# Patient Record
Sex: Male | Born: 1990 | Hispanic: Yes | ZIP: B2Y | Smoking: Never smoker
Health system: Southern US, Community
[De-identification: ages and names within clinical notes are randomized; demographics above are authoritative.]

---

## 2018-08-30 ENCOUNTER — Encounter: Payer: Self-pay | Admitting: Emergency Medicine

## 2018-08-30 ENCOUNTER — Emergency Department
Admission: EM | Admit: 2018-08-30 | Discharge: 2018-08-30 | Disposition: A | Discharge status: Home / Self Care | Payer: Worker's Compensation | Attending: Emergency Medicine | Admitting: Emergency Medicine

## 2018-08-30 ENCOUNTER — Emergency Department: Payer: Worker's Compensation

## 2018-08-30 DIAGNOSIS — W010XXA Fall on same level from slipping, tripping and stumbling without subsequent striking against object, initial encounter: Secondary | ICD-10-CM | POA: Diagnosis not present

## 2018-08-30 DIAGNOSIS — S99911A Unspecified injury of right ankle, initial encounter: Secondary | ICD-10-CM | POA: Diagnosis present

## 2018-08-30 DIAGNOSIS — S92101A Unspecified fracture of right talus, initial encounter for closed fracture: Secondary | ICD-10-CM | POA: Diagnosis not present

## 2018-08-30 DIAGNOSIS — Y929 Unspecified place or not applicable: Secondary | ICD-10-CM | POA: Diagnosis not present

## 2018-08-30 DIAGNOSIS — Y990 Civilian activity done for income or pay: Secondary | ICD-10-CM | POA: Insufficient documentation

## 2018-08-30 DIAGNOSIS — S82891A Other fracture of right lower leg, initial encounter for closed fracture: Secondary | ICD-10-CM

## 2018-08-30 DIAGNOSIS — Y9389 Activity, other specified: Secondary | ICD-10-CM | POA: Insufficient documentation

## 2018-08-30 MED ORDER — OXYCODONE-ACETAMINOPHEN 5-325 MG PO TABS
1.0000 | ORAL_TABLET | Freq: Once | ORAL | Status: AC
  Administered 2018-08-30: 1 via ORAL
  Filled 2018-08-30: qty 1

## 2018-08-30 NOTE — ED Provider Notes (Addendum)
[facility] Emergency Department Provider Note  ____________________________________________   I have reviewed the triage vital signs and the nursing notes. Where available I have reviewed prior notes and, if possible and indicated, outside hospital notes.    HISTORY  Chief Complaint Ankle Pain    HPI Jason Rowe is a 28 y.o. male who is in good shape, felt the back of a truck during work and sprained his ankle, unable to bear weight on it, no injury to knee head chest or anywhere else.  Non-syncopal event.  He refused pain medications for me and for EMS.  No other alleviating or aggravating factors.    History reviewed. No pertinent past medical history.  There are no active problems to display for this patient.   History reviewed. No pertinent surgical history.  Prior to Admission medications   Not on File    Allergies Patient has no known allergies.  History reviewed. No pertinent family history.  Social History Social History   Tobacco Use  . Smoking status: Never Smoker  . Smokeless tobacco: Never Used  Substance Use Topics  . Alcohol use: Never    Frequency: Never  . Drug use: Never    Review of Systems Constitutional: No fever/chills Eyes: No visual changes. ENT: No sore throat. No stiff neck no neck pain Cardiovascular: Denies chest pain. Respiratory: Denies shortness of breath. Gastrointestinal:   no vomiting.  No diarrhea.  No constipation. Genitourinary: Negative for dysuria. Musculoskeletal: Negative lower extremity swelling Skin: Negative for rash. Neurological: Negative for severe headaches, focal weakness or numbness.   ____________________________________________   PHYSICAL EXAM:  VITAL SIGNS: ED Triage Vitals [08/30/18 1531]  Enc Vitals Group     BP (!) 152/84     Pulse Rate 98     Resp 14     Temp 98 F (36.7 C)     Temp Source Oral     SpO2 100 %     Weight      Height      Head Circumference       Peak Flow      Pain Score 2     Pain Loc      Pain Edu?      Excl. in GC?     Constitutional: Alert and oriented. Well appearing and in no acute distress. Eyes: Conjunctivae are normal Head: Atraumatic HEENT: No congestion/rhinnorhea. Mucous membranes are moist.  Oropharynx non-erythematous Neck:   Nontender  Cardiovascular: Normal rate, regular rhythm. Grossly normal heart sounds.  Good peripheral circulation. Respiratory: Normal respiratory effort.  No retractions. Lungs CTAB. Abdominal: Soft and nontender. No distention. No guarding no rebound Back:  There is no focal tenderness or step off.  there is no midline tenderness there are no lesions noted. there is no CVA tenderness Musculoskeletal: Right foot is not tender, right ankle has some tenderness, there is some tenderness along the medial malleolus.  Strong distal pulses, no knee or hip discomfort. No joint effusions, no DVT signs strong distal pulses no edema Neurologic:  Normal speech and language. No gross focal neurologic deficits are appreciated.  Skin:  Skin is warm, dry and intact. No rash noted. Psychiatric: Mood and affect are normal. Speech and behavior are normal.  ____________________________________________   LABS (all labs ordered are listed, but only abnormal results are displayed)  Labs Reviewed - No data to display  Pertinent labs  results that were available during my care of the patient were reviewed by me and considered  in my medical decision making (see chart for details). ____________________________________________  EKG  I personally interpreted any EKGs ordered by me or triage  ____________________________________________  RADIOLOGY  Pertinent labs & imaging results that were available during my care of the patient were reviewed by me and considered in my medical decision making (see chart for details). If possible, patient and/or family made aware of any abnormal findings.  Dg Ankle  Complete Right  Result Date: 08/30/2018 CLINICAL DATA:  Right ankle pain EXAM: RIGHT ANKLE - COMPLETE 3+ VIEW COMPARISON:  None. FINDINGS: There is slight cortical irregularity along the medial talus adjacent to the medial malleolus which may reflect a subtle nondisplaced fracture. There is no other fracture or dislocation. There is no evidence of arthropathy or other focal bone abnormality. There is soft tissue swelling over the lateral malleolus. IMPRESSION: Slight cortical irregularity along the medial talus adjacent to the medial malleolus which may reflect a subtle nondisplaced fracture. Soft tissue swelling over the lateral malleolus. Electronically Signed   By: Elige Ko   On: 08/30/2018 16:24   ____________________________________________    PROCEDURES  Procedure(s) performed: None  Procedures  Critical Care performed: None  ____________________________________________   INITIAL IMPRESSION / ASSESSMENT AND PLAN / ED COURSE  Pertinent labs & imaging results that were available during my care of the patient were reviewed by me and considered in my medical decision making (see chart for details).  Patient with what appears to be a small nondisplaced fracture of his medial malleolus, x-ray does not show any other abnormalities and there is no other evidence of injury from the fall he still is denying pain medication we will place him in a splint and follow-up with orthopedic surgery.  No other injury noted.  ----------------------------------------- 5:40 PM on 08/30/2018 -----------------------------------------  Small nondisplaced fracture, splint placed, neurovascular intact, discharge instructions were given and understood.  She does not want a prescription for pain medication but he would like to have one before he goes.  He has been instructed that he cannot drive on pain medication.    ____________________________________________   FINAL CLINICAL IMPRESSION(S) / ED  DIAGNOSES  Final diagnoses:  None      This chart was dictated using voice recognition software.  Despite best efforts to proofread,  errors can occur which can change meaning.      Jeanmarie Plant, MD 08/30/18 1657    Jeanmarie Plant, MD 08/30/18 1740    Jeanmarie Plant, MD 08/30/18 [phone]

## 2018-08-30 NOTE — ED Notes (Signed)
Posterior splint applied with ace bandage and crutches provided   Lm edt

## 2018-08-30 NOTE — ED Notes (Signed)
Report received 

## 2018-08-30 NOTE — ED Notes (Signed)
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  Located in [place].  Spoke with patients manager alan [phone] and no testing to be done.

## 2018-08-30 NOTE — ED Notes (Signed)
Pt given meal tray  Lm edt

## 2018-08-30 NOTE — ED Notes (Signed)
Pt was working and a strap got caught around right ankle and pt fell.  Swelling to right ankle. DP pulse right leg WNL.

## 2018-08-30 NOTE — ED Notes (Signed)
Will help patient arrange stay at hotel since from out of town and cannot drive with splint on right leg.

## 2018-08-30 NOTE — ED Notes (Signed)
Will discharge in cab to hotel.

## 2018-08-30 NOTE — ED Triage Notes (Signed)
Pt presents via POV s/p fall with right ankle pain. Swelling noted.

## 2018-08-30 NOTE — ED Notes (Signed)
Patient transported to X-ray 

## 2020-09-24 IMAGING — CR DG ANKLE COMPLETE 3+V*R*
3 series · 3 of 3 positions shown · non-contrast
Comparison: None.

CLINICAL DATA: Right ankle pain

EXAM:
RIGHT ANKLE - COMPLETE 3+ VIEW

[ankle ap]
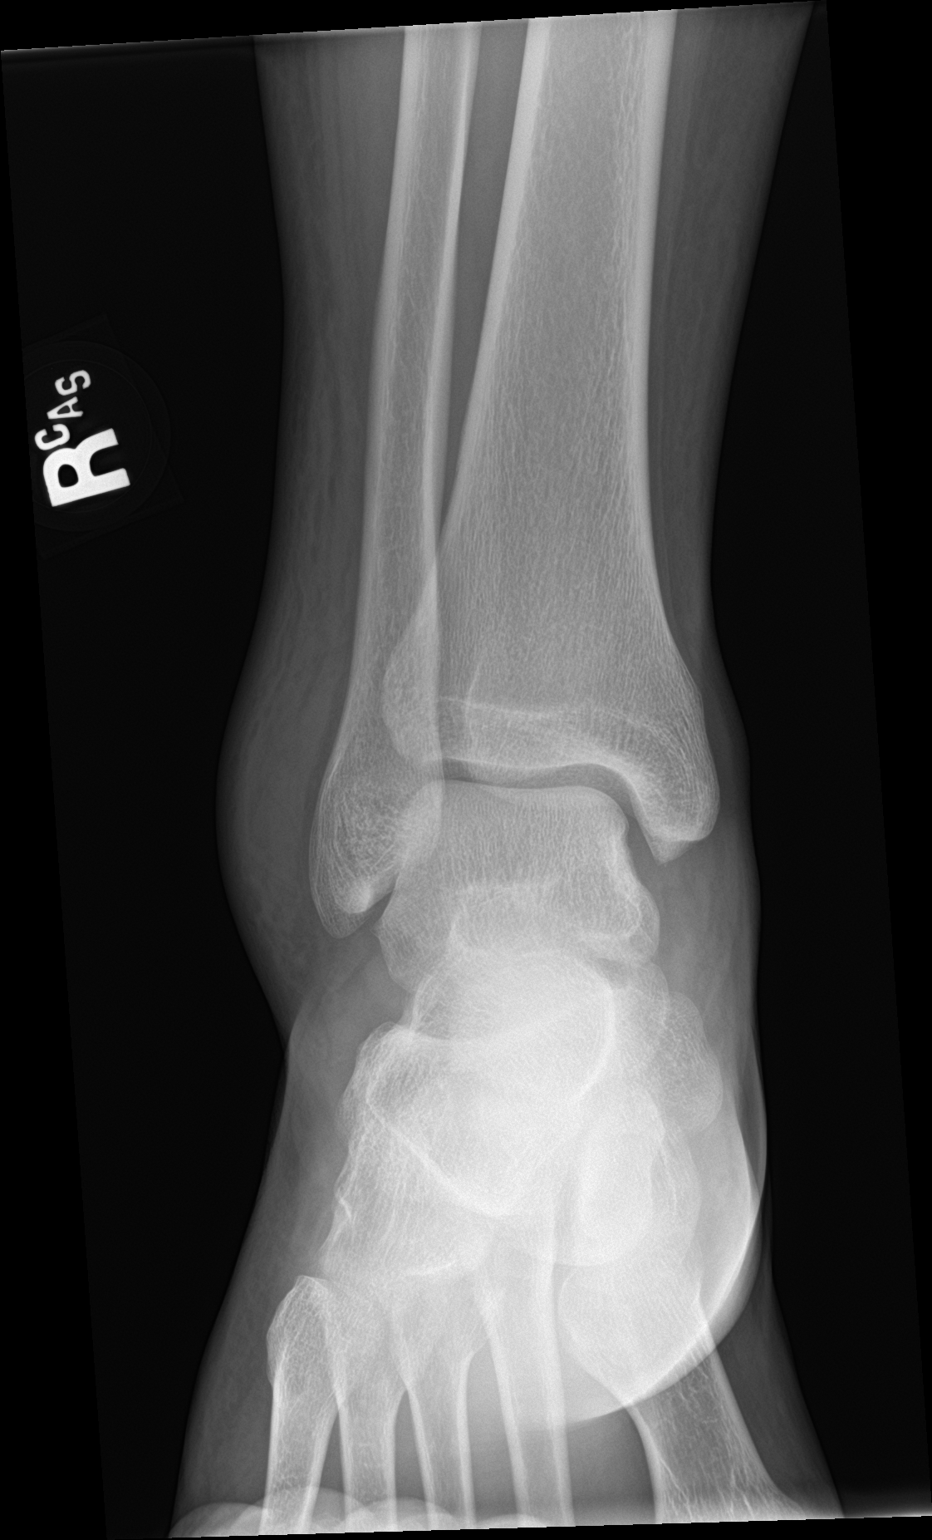

[ankle obl]
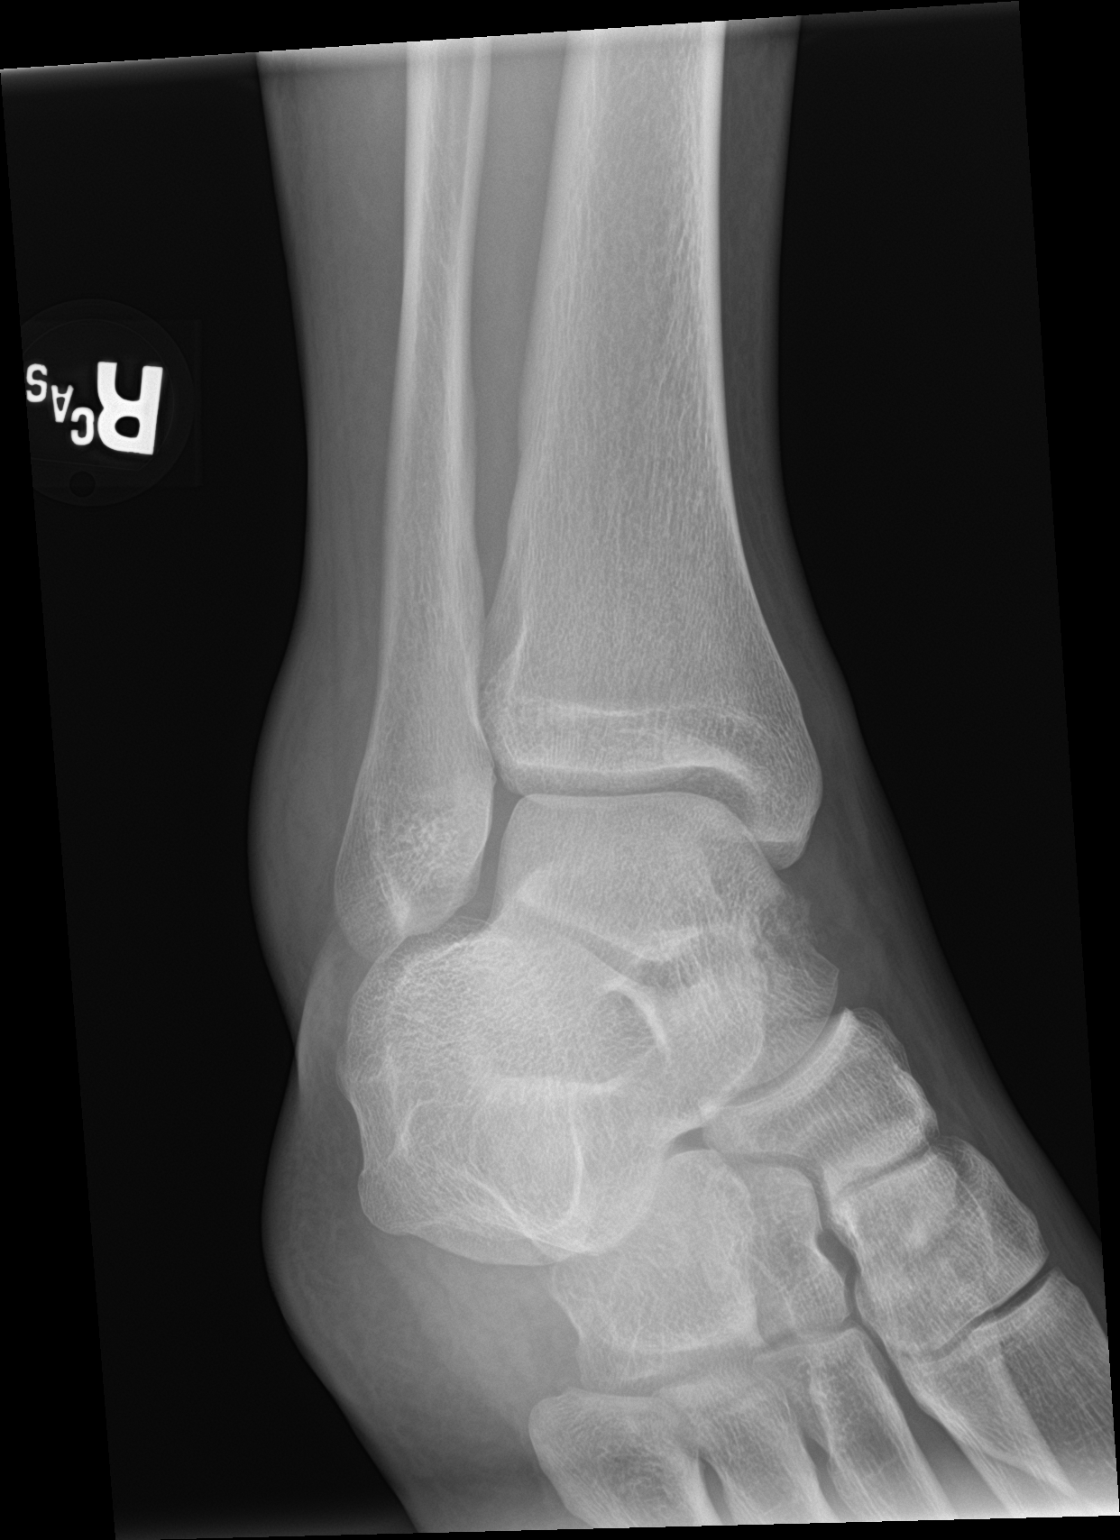

[ankle lat]
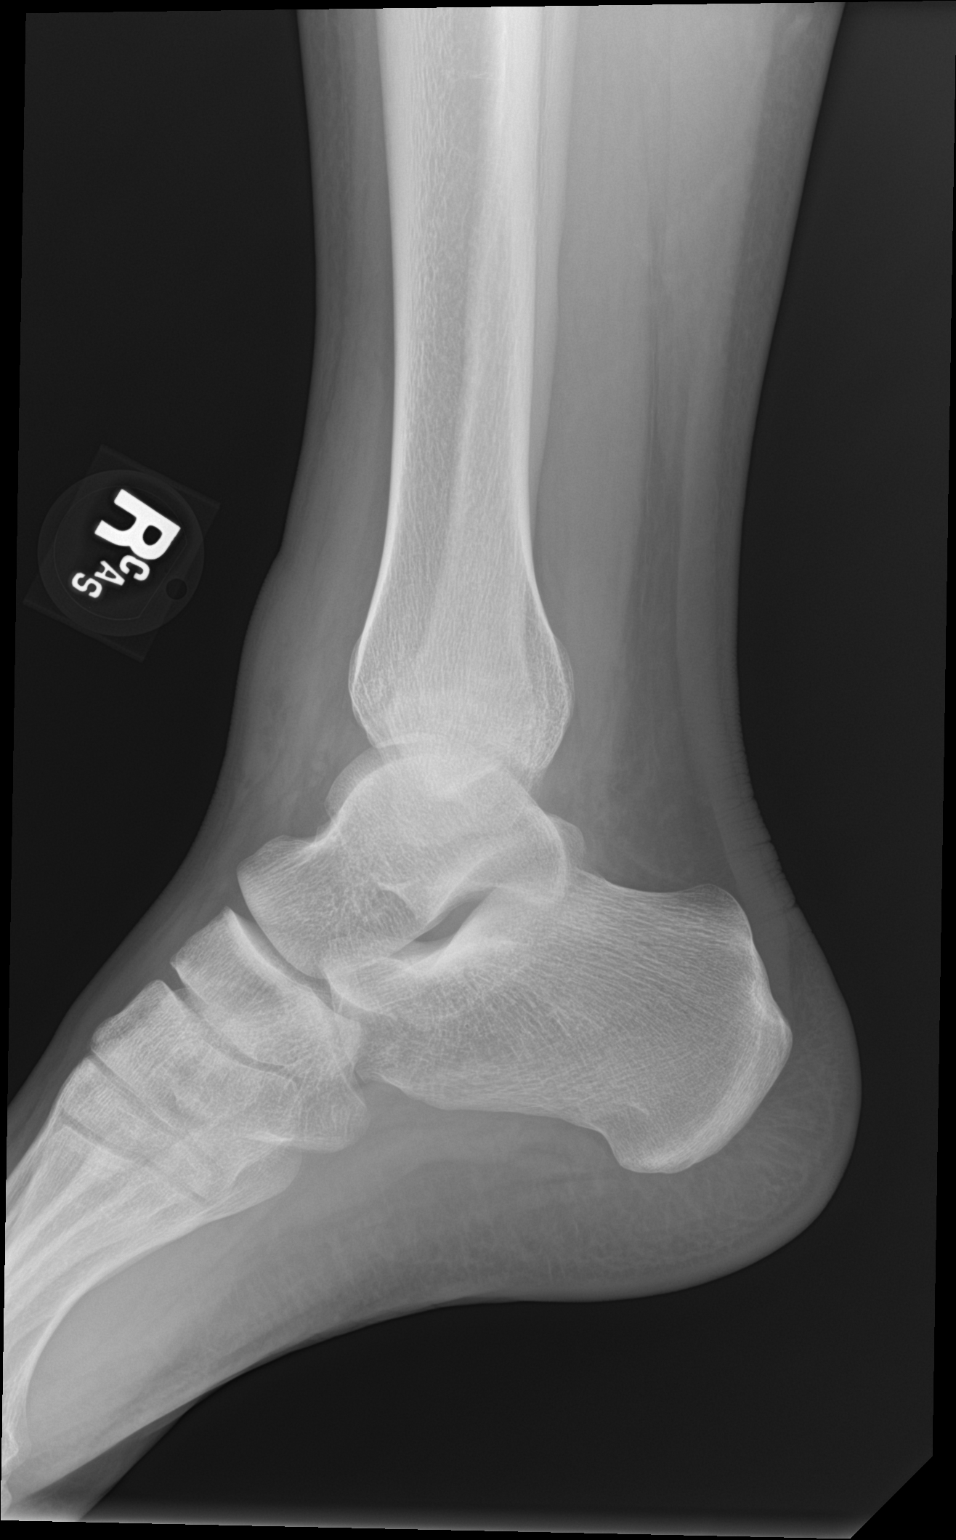

[3 of 3 positions shown; findings below may reference images not displayed]

FINDINGS: There is slight cortical irregularity along the medial talus
adjacent to the medial malleolus which may reflect a subtle
nondisplaced fracture. There is no other fracture or dislocation.
There is no evidence of arthropathy or other focal bone abnormality.
There is soft tissue swelling over the lateral malleolus.
IMPRESSION: Slight cortical irregularity along the medial talus adjacent to the
medial malleolus which may reflect a subtle nondisplaced fracture.

Soft tissue swelling over the lateral malleolus.
